# Patient Record
Sex: Male | Born: 1990 | Race: White | Hispanic: No | Marital: Married | Smoking: Current some day smoker
Health system: Southern US, Community
[De-identification: ages and names within clinical notes are randomized; demographics above are authoritative.]

## PROBLEM LIST (undated history)

## (undated) HISTORY — PX: OTHER SURGICAL HISTORY: SHX169

---
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
## 2012-06-23 IMAGING — CR DG RIBS 2V*R*
1 series · 2 of 2 positions shown · non-contrast
Comparison: none

REASON FOR EXAM: rib pain
COMMENTS:   May transport without cardiac monitor

PROCEDURE:     DXR - DXR RIBS RIGHT UNILATERAL  - September 03, 2010  [DATE]
RESULT:     No rib fracture or other acute bony abnormality is identified.
No pneumothorax or pleural effusion is seen.

[Series 1: view not recorded · 0.17mm/px · 2 of 2 slices shown]
[im 1/2]
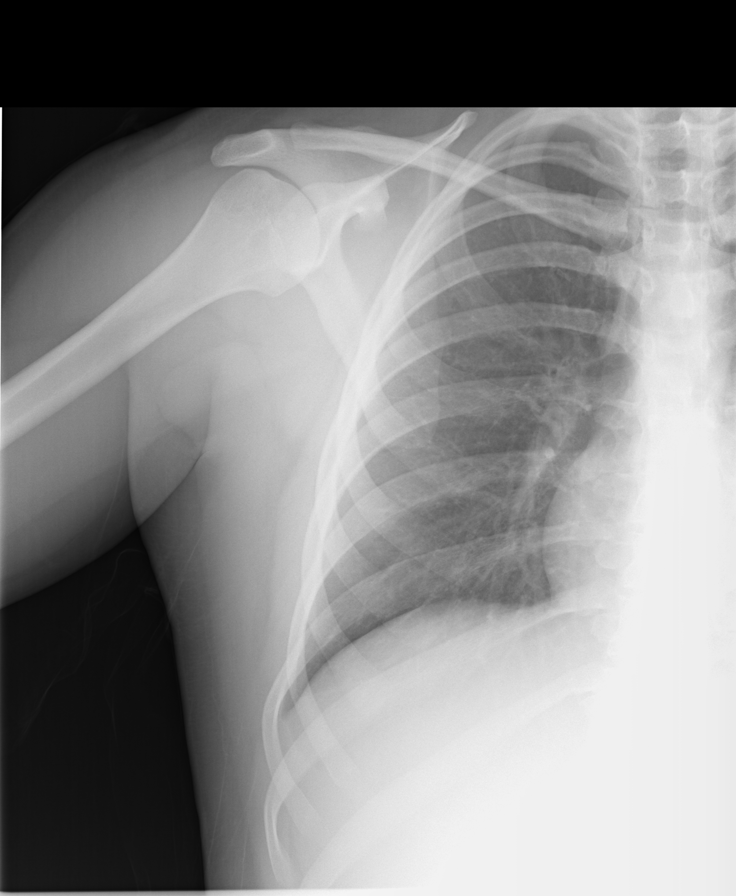
[im 2/2]
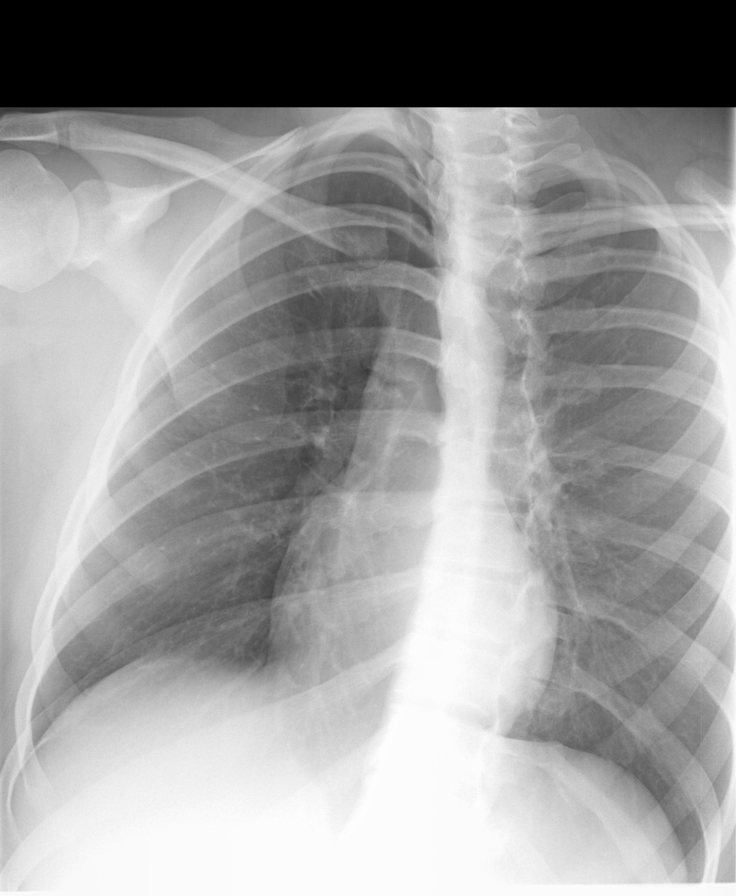

[2 of 2 positions shown; findings below may reference images not displayed]

IMPRESSION: No significant abnormalities are noted.

## 2012-06-23 IMAGING — CR RIGHT TIBIA AND FIBULA - 2 VIEW
1 series · 2 of 2 positions shown · non-contrast
Comparison: none

REASON FOR EXAM: leg pain
COMMENTS:   May transport without cardiac monitor

RESULT:     No fracture, dislocation or other acute bony abnormality is
identified.

[Series 1: view not recorded · 0.17mm/px · 2 of 2 slices shown]
[im 1/2]
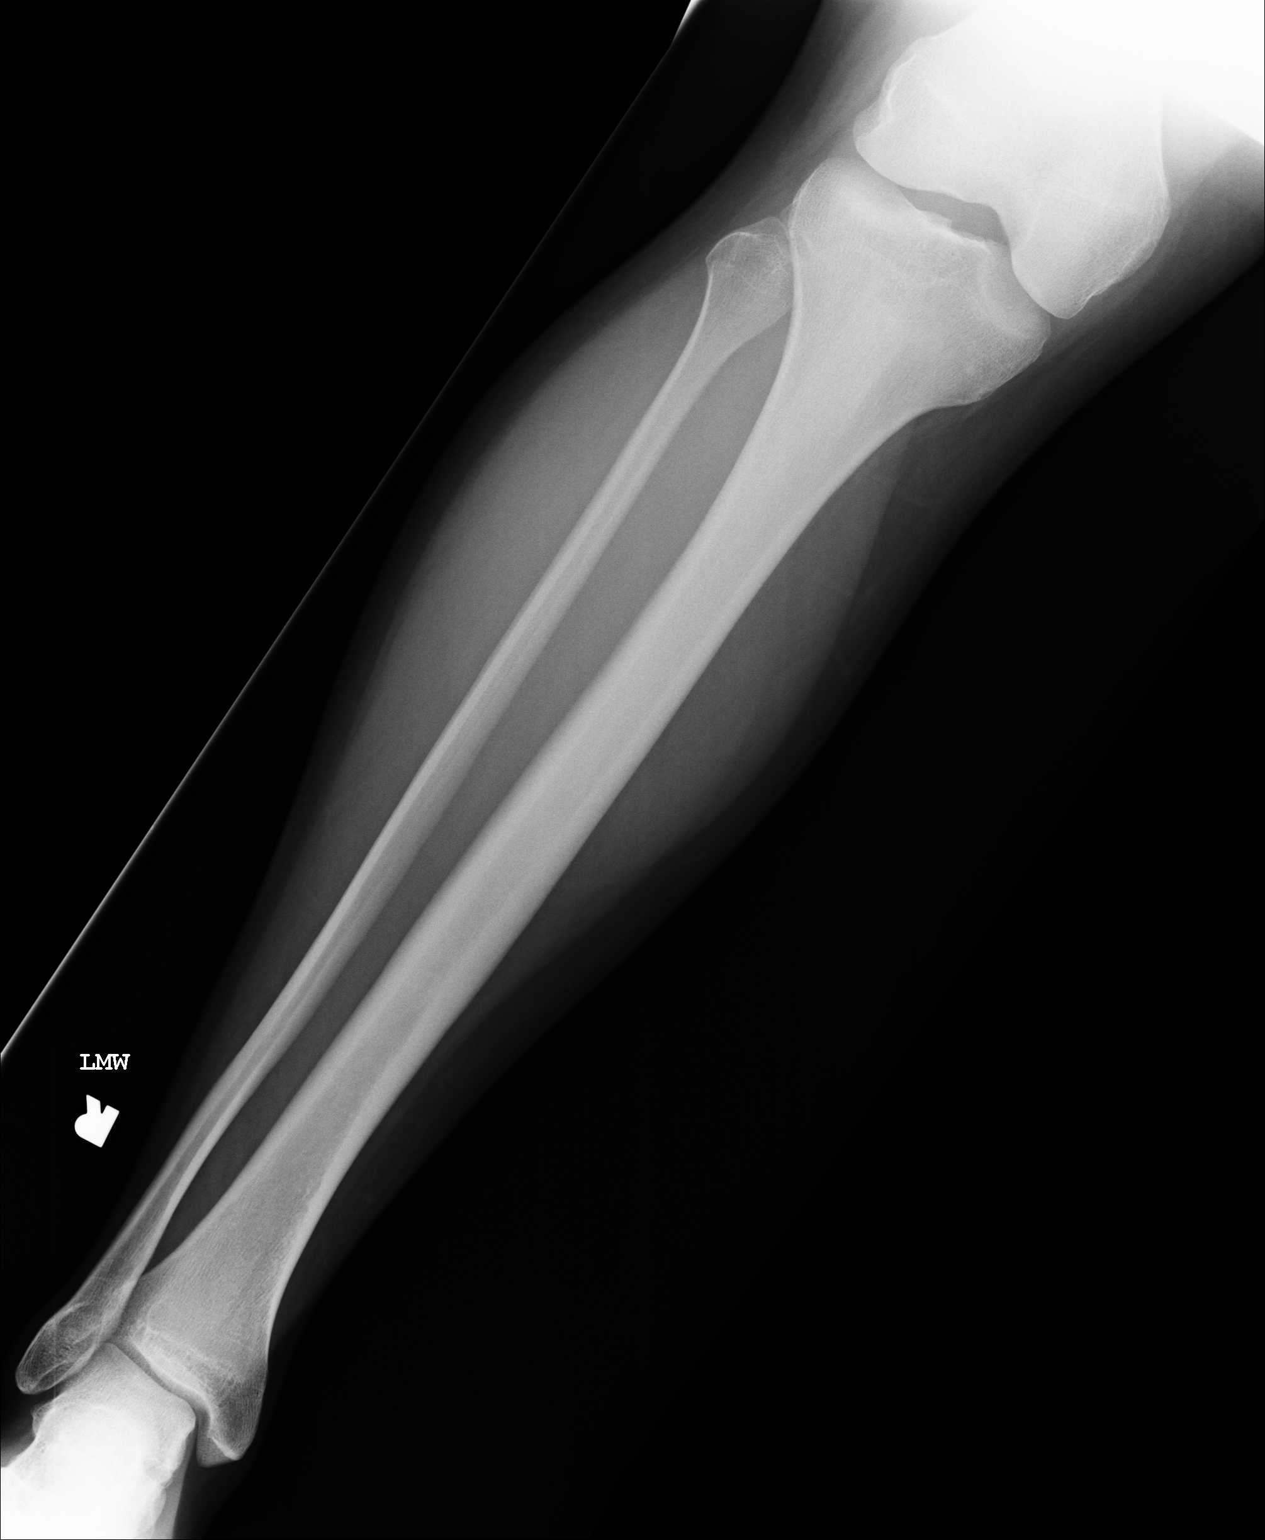
[im 2/2]
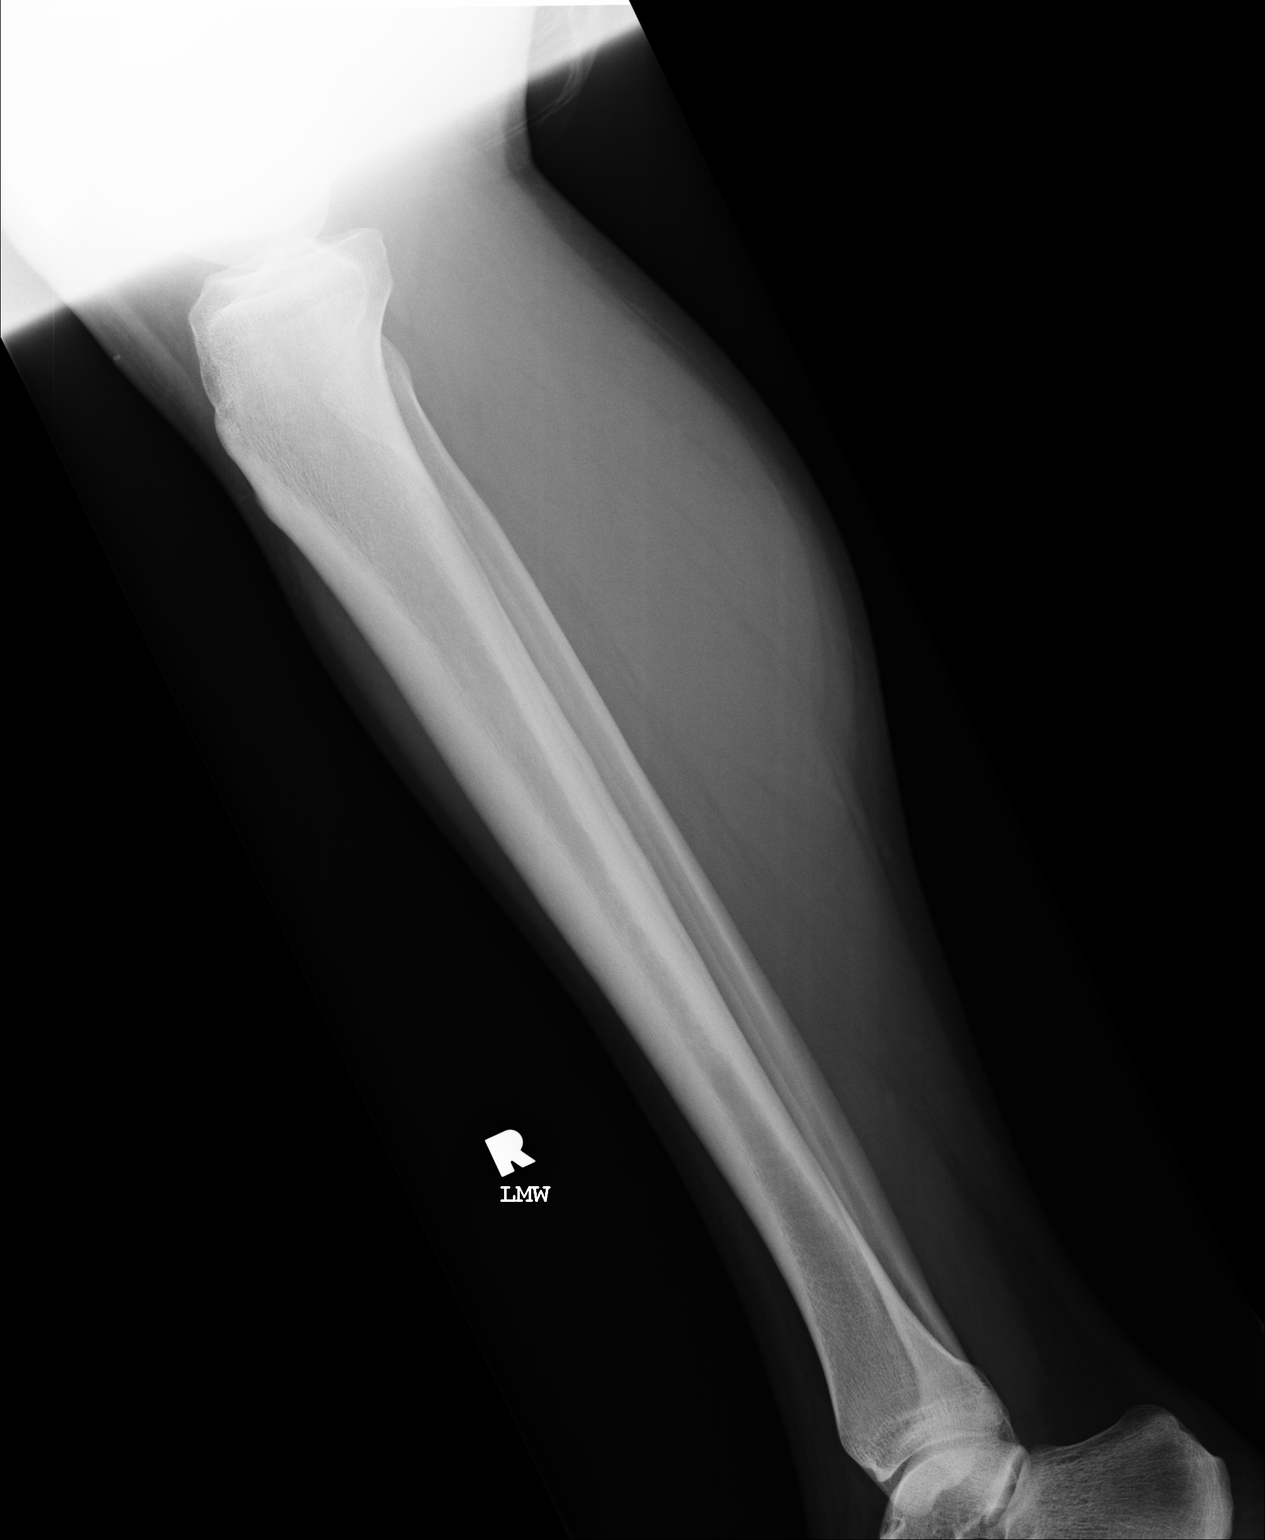

[2 of 2 positions shown; findings below may reference images not displayed]

IMPRESSION: No significant osseous abnormalities are noted.

## 2012-06-23 IMAGING — CR RIGHT ANKLE - COMPLETE 3+ VIEW
1 series · 5 of 5 positions shown · non-contrast
Comparison: none

REASON FOR EXAM: ankle pain
COMMENTS:   May transport without cardiac monitor

PROCEDURE:     DXR - DXR ANKLE RIGHT COMPLETE  - September 03, 2010  [DATE]
RESULT:     No fracture, dislocation or other acute bony abnormality is
identified. The ankle mortise is well maintained.

[Series 1: view not recorded · 0.17mm/px · 5 of 5 slices shown]
[im 1/5]
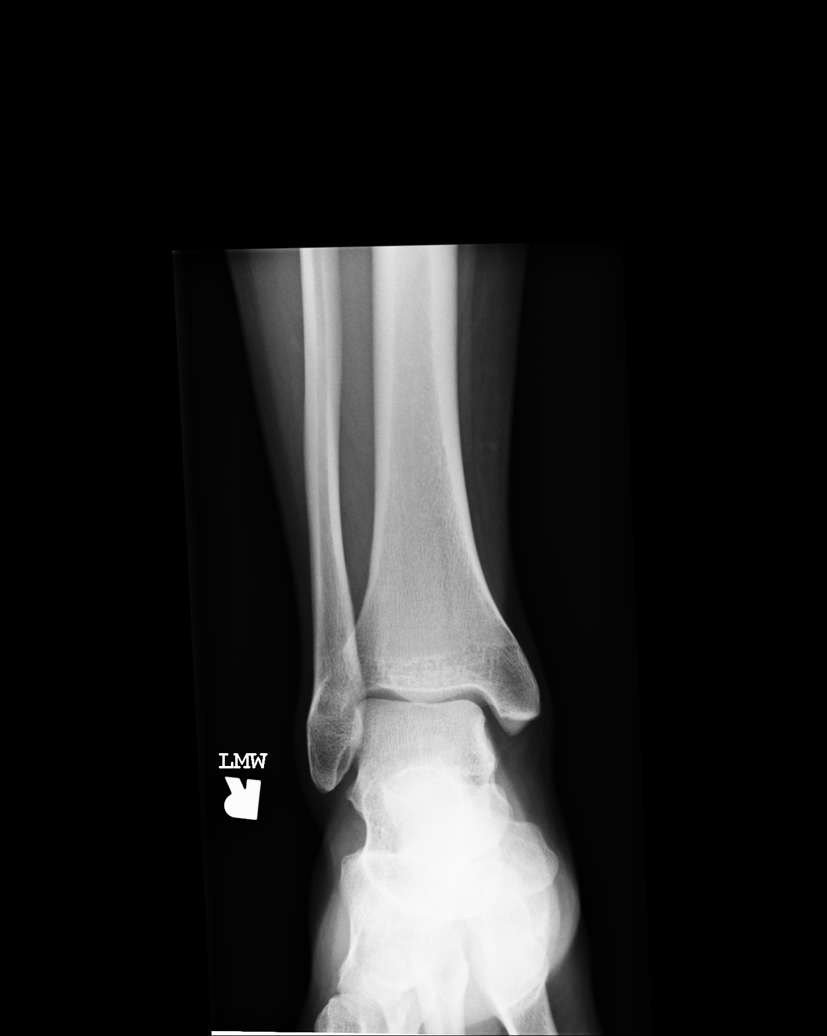
[im 2/5]
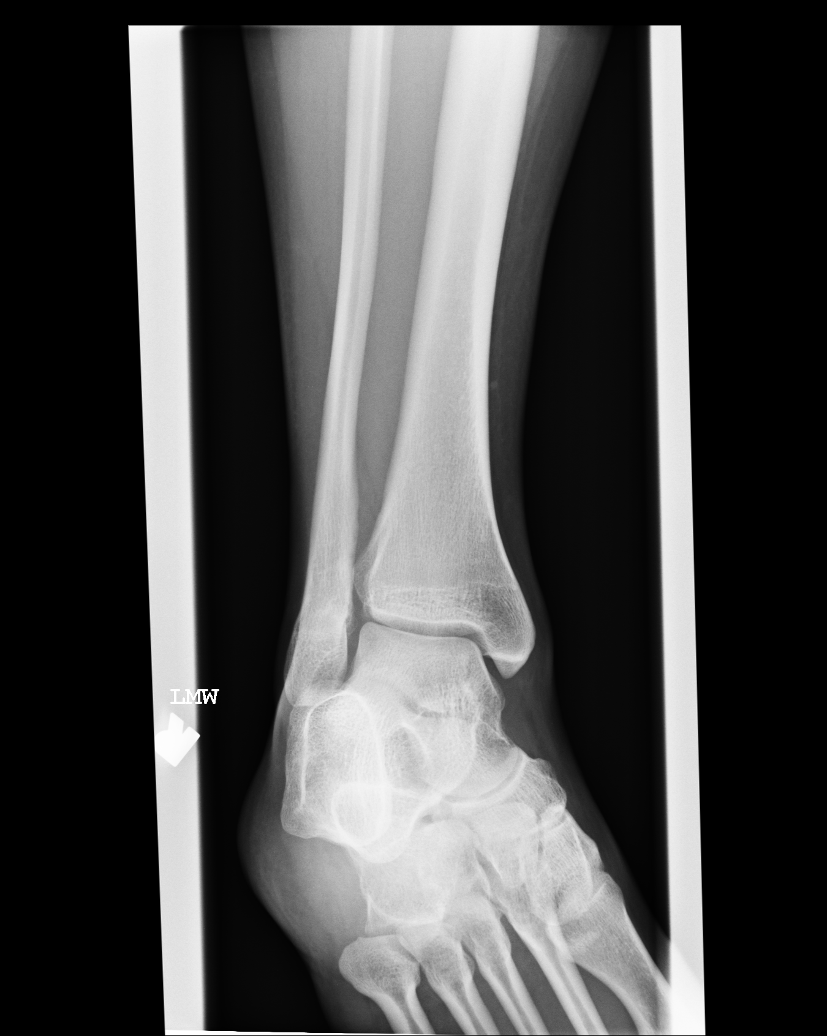
[im 3/5]
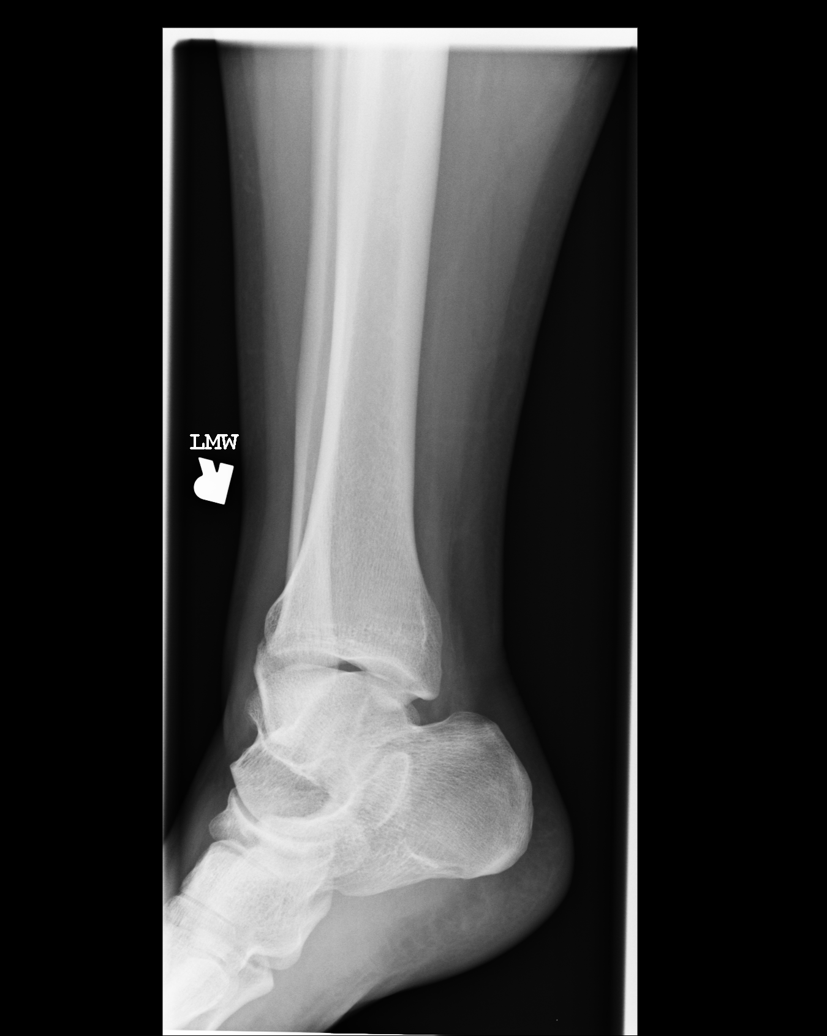
[im 4/5]
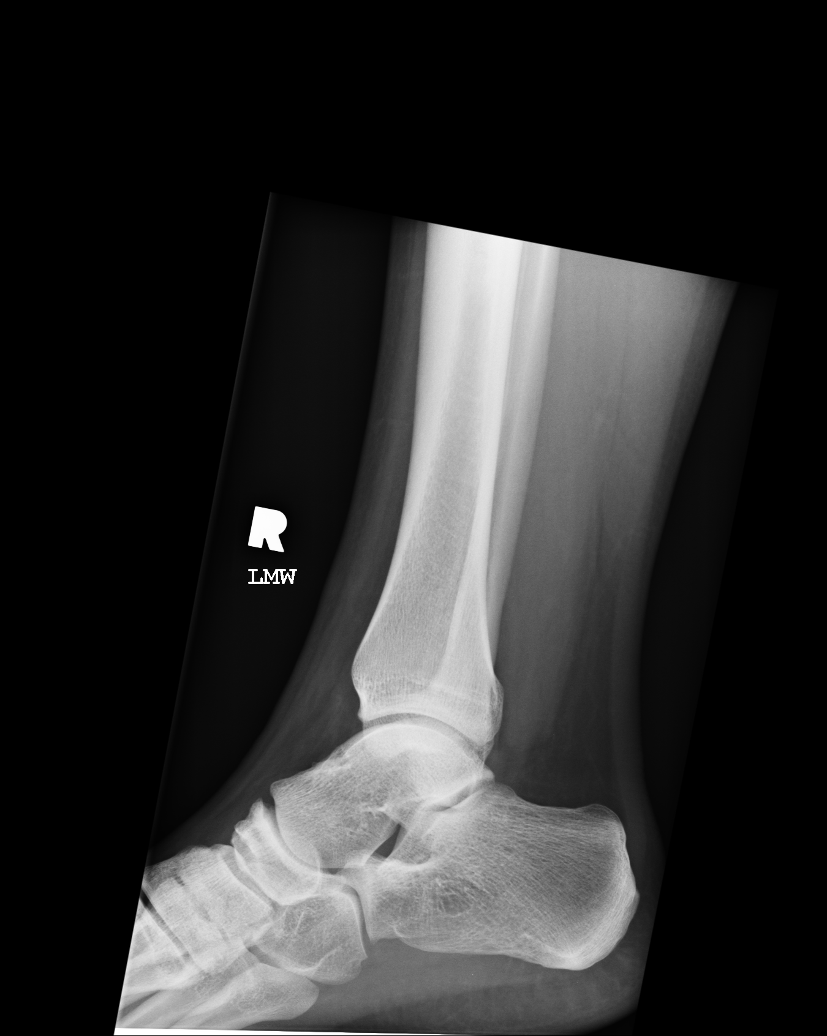
[im 5/5]
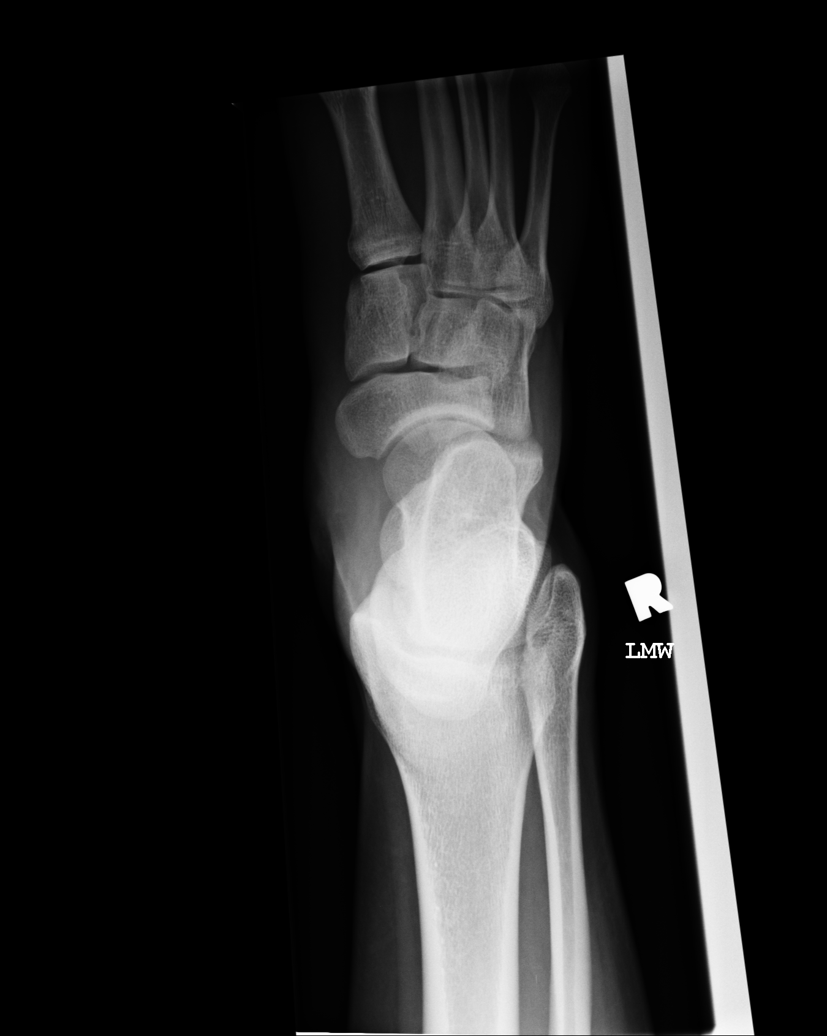

[5 of 5 positions shown; findings below may reference images not displayed]

IMPRESSION: No significant osseous abnormalities are noted.

## 2021-10-14 ENCOUNTER — Emergency Department
Admission: EM | Admit: 2021-10-14 | Discharge: 2021-10-14 | Disposition: A | Discharge status: Home / Self Care | Payer: BC Managed Care – PPO | Attending: Emergency Medicine | Admitting: Emergency Medicine

## 2021-10-14 ENCOUNTER — Encounter: Payer: Self-pay | Admitting: Emergency Medicine

## 2021-10-14 ENCOUNTER — Other Ambulatory Visit: Payer: Self-pay

## 2021-10-14 DIAGNOSIS — M545 Low back pain, unspecified: Secondary | ICD-10-CM | POA: Diagnosis present

## 2021-10-14 DIAGNOSIS — H6642 Suppurative otitis media, unspecified, left ear: Secondary | ICD-10-CM | POA: Insufficient documentation

## 2021-10-14 MED ORDER — CYCLOBENZAPRINE HCL 10 MG PO TABS: 10.0000 mg | ORAL_TABLET | Freq: Three times a day (TID) | ORAL | 0 refills | Status: AC | PRN

## 2021-10-14 MED ORDER — AMOXICILLIN-POT CLAVULANATE 875-125 MG PO TABS
1.0000 | ORAL_TABLET | Freq: Once | ORAL | Status: DC
  Filled 2021-10-14: qty 1

## 2021-10-14 MED ORDER — KETOROLAC TROMETHAMINE 10 MG PO TABS: 10.0000 mg | ORAL_TABLET | Freq: Four times a day (QID) | ORAL | 0 refills | Status: AC | PRN

## 2021-10-14 MED ORDER — KETOROLAC TROMETHAMINE 30 MG/ML IJ SOLN
30.0000 mg | Freq: Once | INTRAMUSCULAR | Status: AC
  Administered 2021-10-14: 30 mg via INTRAVENOUS
  Filled 2021-10-14: qty 1

## 2021-10-14 MED ORDER — AMOXICILLIN-POT CLAVULANATE 875-125 MG PO TABS: 1.0000 | ORAL_TABLET | Freq: Two times a day (BID) | ORAL | 0 refills | Status: AC

## 2021-10-14 MED ORDER — AMOXICILLIN-POT CLAVULANATE 875-125 MG PO TABS
1.0000 | ORAL_TABLET | Freq: Once | ORAL | Status: AC
  Administered 2021-10-14: 1 via ORAL

## 2021-10-14 NOTE — Discharge Instructions (Addendum)
Please take the Toradol 1 pill 4 times a day for the low back pain.  Take the Flexeril 1 pill 3 times a day for the back pain.  They should help.  Please return or follow-up with primary care if your worse or no better in about a week.  Take the Augmentin 1 pill 3 times a day day as needed for the ear infection.  Also follow-up with primary care for them or you can see Dr. Pena Callas who is on-call for ENT if you are not well in about a week.  It might be good to have him look at that ear anyway there is a white patch on your ear canal that I cannot see very clearly that possibly could be something called a cholesteatoma.  If it is it will need to be addressed because over a longer period of time it can actually run your hearing.  Primary care groups or clinics that she can follow-up include [facility] who has a walk-in office.  He can follow-up with [facility] or [facility] or [facility] or [facility] or [facility].

## 2021-10-14 NOTE — ED Provider Triage Note (Signed)
Emergency Medicine Provider Triage Evaluation Note  Maxwell Bell , a 31 y.o. male  was evaluated in triage.  Pt complains of left ear pain for a couple of weeks. Had ear surgery at age 23, has problems ever since. Plays music in his ear to help it feel better. No nasal congestion/fever. Also has low back pain since prison which he attributes to laying on thin mattresses, no leg weakness/paresthesias/urinary/bladder issues. Able to ambulate.  Review of Systems  Positive: Ear pain, back pain Negative: weakness/paresthesias/urinary/bladder issues  Physical Exam  Ht 5\' 10"  (1.778 m)   Wt 119.3 kg   BMI 37.74 kg/m  Gen:   Awake, no distress   Resp:  Normal effort  MSK:   Moves extremities without difficulty  Other:    Medical Decision Making  Medically screening exam initiated at 1:19 PM.  Appropriate orders placed.  Dolores Patty was informed that the remainder of the evaluation will be completed by another provider, this initial triage assessment does not replace that evaluation, and the importance of remaining in the ED until their evaluation is complete.     Marquette Old, PA-C 10/14/21 1322

## 2021-10-14 NOTE — ED Triage Notes (Signed)
Pt reports pain to left ear and pressure for the last 2 weeks. Pt denies nasal congestion or other sx's.  Pt reports also has some back pain. Pt reports was put in prison 4-5 years ago and when he was released he has back pain and he continues to have pain to his back

## 2021-10-22 NOTE — ED Provider Notes (Signed)
[facility] Provider Note    Event Date/Time   First MD Initiated Contact with Patient 10/14/21 1525     (approximate)   History   Otalgia and Back Pain   HPI  Maxwell Bell is a 31 y.o. male patient comes in complaining of low back pain on the right side which develops is chronic but he is out of medicines.  He also complains of pain in his ear.  This has been going on for several days.      Physical Exam   Triage Vital Signs: ED Triage Vitals  Enc Vitals Group     BP 10/14/21 1319 (!) 137/97     Pulse Rate 10/14/21 1319 76     Resp 10/14/21 1319 20     Temp 10/14/21 1319 98.1 F (36.7 C)     Temp Source 10/14/21 1319 Oral     SpO2 10/14/21 1319 99 %     Weight 10/14/21 1257 263 lb (119.3 kg)     Height 10/14/21 1257 5\' 10"  (1.778 m)     Head Circumference --      Peak Flow --      Pain Score 10/14/21 1256 8     Pain Loc --      Pain Edu? --      Excl. in [place]? --     Most recent vital signs: Vitals:   10/14/21 1319 10/14/21 1627  BP: (!) 137/97 125/81  Pulse: 76 (!) 57  Resp: 20 18  Temp: 98.1 F (36.7 C) 98.1 F (36.7 C)  SpO2: 99% 99%     General: Awake, no distress.  Ears right TM is somewhat red there is a whitish area in the center of the TM which I am not sure it is a scar or a cholesteatoma.  It looks somewhat irregular and lumpy. CV:  Good peripheral perfusion.  Heart regular rate and rhythm no audible murmurs Resp:  Normal effort.  Lungs are clear Abd:  No distention.  Nontender Back: Tender over the muscles on the right side.  No spinal tenderness no hip tenderness.  Straight leg raise is negative.  There is no numbness or weakness in the legs.  Patient reports pain is chronic.   ED Results / Procedures / Treatments   Labs (all labs ordered are listed, but only abnormal results are displayed) Labs Reviewed - No data to display   EKG     RADIOLOGY   PROCEDURES:  Critical Care performed:    Procedures   MEDICATIONS ORDERED IN ED: Medications  ketorolac (TORADOL) 30 MG/ML injection 30 mg (30 mg Intravenous Given 10/14/21 1625)  amoxicillin-clavulanate (AUGMENTIN) 875-125 MG per tablet 1 tablet (1 tablet Oral Given 10/14/21 1621)     IMPRESSION / MDM / ASSESSMENT AND PLAN / ED COURSE  I reviewed the triage vital signs and the nursing notes. I have given the patient some medications for his back pain and Augmentin for the earache and the otitis media.  I will have him follow-up with ENT to check on the white area and see if it is a cholesterol it [place] or not.   Patient's presentation is most consistent with acute, uncomplicated illness.       FINAL CLINICAL IMPRESSION(S) / ED DIAGNOSES   Final diagnoses:  Acute right-sided low back pain without sciatica  Suppurative otitis media of left ear, unspecified chronicity     Rx / DC Orders   ED Discharge Orders  Ordered    cyclobenzaprine (FLEXERIL) 10 MG tablet  3 times daily PRN        10/14/21 1617    ketorolac (TORADOL) 10 MG tablet  Every 6 hours PRN        10/14/21 1617    amoxicillin-clavulanate (AUGMENTIN) 875-125 MG tablet  2 times daily        10/14/21 1622             Note:  This document was prepared using Dragon voice recognition software and may include unintentional dictation errors.   Nena Polio, MD 10/22/21 2300

## 2021-12-02 ENCOUNTER — Other Ambulatory Visit: Payer: Self-pay

## 2021-12-02 ENCOUNTER — Emergency Department
Admission: EM | Admit: 2021-12-02 | Discharge: 2021-12-02 | Disposition: A | Discharge status: Home / Self Care | Payer: BC Managed Care – PPO | Attending: Emergency Medicine | Admitting: Emergency Medicine

## 2021-12-02 DIAGNOSIS — T7840XA Allergy, unspecified, initial encounter: Secondary | ICD-10-CM | POA: Insufficient documentation

## 2021-12-02 DIAGNOSIS — R21 Rash and other nonspecific skin eruption: Secondary | ICD-10-CM | POA: Diagnosis present

## 2021-12-02 LAB — CBC WITH DIFFERENTIAL/PLATELET
Abs Immature Granulocytes: 0.03 10*3/uL (ref 0.00–0.07)
Basophils Absolute: 0.1 10*3/uL (ref 0.0–0.1)
Basophils Relative: 1 %
Eosinophils Absolute: 0.7 10*3/uL — ABNORMAL HIGH (ref 0.0–0.5)
Eosinophils Relative: 9 %
HCT: 44.6 % (ref 39.0–52.0)
Hemoglobin: 15.5 g/dL (ref 13.0–17.0)
Immature Granulocytes: 0 %
Lymphocytes Relative: 35 %
Lymphs Abs: 2.7 10*3/uL (ref 0.7–4.0)
MCH: 29.3 pg (ref 26.0–34.0)
MCHC: 34.8 g/dL (ref 30.0–36.0)
MCV: 84.3 fL (ref 80.0–100.0)
Monocytes Absolute: 0.8 10*3/uL (ref 0.1–1.0)
Monocytes Relative: 10 %
Neutro Abs: 3.5 10*3/uL (ref 1.7–7.7)
Neutrophils Relative %: 45 %
Platelets: 231 10*3/uL (ref 150–400)
RBC: 5.29 MIL/uL (ref 4.22–5.81)
RDW: 12.2 % (ref 11.5–15.5)
WBC: 7.7 10*3/uL (ref 4.0–10.5)
nRBC: 0 % (ref 0.0–0.2)

## 2021-12-02 LAB — BASIC METABOLIC PANEL
Anion gap: 8 (ref 5–15)
BUN: 14 mg/dL (ref 6–20)
CO2: 22 mmol/L (ref 22–32)
Calcium: 9 mg/dL (ref 8.9–10.3)
Chloride: 108 mmol/L (ref 98–111)
Creatinine, Ser: 0.86 mg/dL (ref 0.61–1.24)
GFR, Estimated: >60 mL/min (ref >60)
Glucose, Bld: 115 mg/dL — ABNORMAL HIGH (ref 70–99)
Potassium: 3.6 mmol/L (ref 3.5–5.1)
Sodium: 138 mmol/L (ref 135–145)

## 2021-12-02 MED ORDER — METHYLPREDNISOLONE SODIUM SUCC 125 MG IJ SOLR: 125.0000 mg | Freq: Once | INTRAMUSCULAR | Status: DC

## 2021-12-02 MED ORDER — PREDNISONE 10 MG (21) PO TBPK: ORAL_TABLET | ORAL | 0 refills | Status: AC

## 2021-12-02 MED ORDER — METHYLPREDNISOLONE SODIUM SUCC 125 MG IJ SOLR
INTRAMUSCULAR | Status: AC
  Filled 2021-12-02: qty 2

## 2021-12-02 MED ORDER — METHYLPREDNISOLONE SODIUM SUCC 125 MG IJ SOLR
125.0000 mg | Freq: Every day | INTRAMUSCULAR | Status: DC
  Administered 2021-12-02: 125 mg via INTRAVENOUS

## 2021-12-02 MED ORDER — FAMOTIDINE IN NACL 20-0.9 MG/50ML-% IV SOLN
20.0000 mg | Freq: Once | INTRAVENOUS | Status: AC
  Administered 2021-12-02: 20 mg via INTRAVENOUS
  Filled 2021-12-02: qty 50

## 2021-12-02 MED ORDER — DIPHENHYDRAMINE HCL 50 MG/ML IJ SOLN
50.0000 mg | Freq: Once | INTRAMUSCULAR | Status: AC
  Administered 2021-12-02: 50 mg via INTRAVENOUS
  Filled 2021-12-02: qty 1

## 2021-12-02 NOTE — ED Provider Notes (Signed)
[facility] Provider Note  Patient Contact: 10:06 PM (approximate)   History   Allergic Reaction   HPI  Maxwell Bell is a 31 y.o. male with an unremarkable past medical history, presents to the emergency department with facial rash that he noticed after he got out of the shower last night.  Patient states that he did start a new shampoo.  No vomiting or diarrhea.  Patient does have a small region of rash along left axilla.  No chest pain, chest tightness or shortness of breath.  No abdominal pain or syncope.      Physical Exam   Triage Vital Signs: ED Triage Vitals  Enc Vitals Group     BP 12/02/21 2151 (!) 134/97     Pulse Rate 12/02/21 2148 82     Resp 12/02/21 2148 16     Temp 12/02/21 2148 98.5 F (36.9 C)     Temp Source 12/02/21 2148 Oral     SpO2 12/02/21 2148 98 %     Weight 12/02/21 2149 255 lb (115.7 kg)     Height 12/02/21 2149 5\' 10"  (1.778 m)     Head Circumference --      Peak Flow --      Pain Score 12/02/21 2149 0     Pain Loc --      Pain Edu? --      Excl. in [place]? --     Most recent vital signs: Vitals:   12/02/21 2148 12/02/21 2151  BP:  (!) 134/97  Pulse: 82   Resp: 16   Temp: 98.5 F (36.9 C)   SpO2: 98%      General: Alert and in no acute distress. Eyes:  PERRL. EOMI. Head: No acute traumatic findings ENT:      Nose: No congestion/rhinnorhea.      Mouth/Throat: Mucous membranes are moist. Neck: No stridor. No cervical spine tenderness to palpation. Cardiovascular:  Good peripheral perfusion Respiratory: Normal respiratory effort without tachypnea or retractions. Lungs CTAB. Good air entry to the bases with no decreased or absent breath sounds. Gastrointestinal: Bowel sounds 4 quadrants. Soft and nontender to palpation. No guarding or rigidity. No palpable masses. No distention. No CVA tenderness. Musculoskeletal: Full range of motion to all extremities.  Neurologic:  No gross focal neurologic deficits are  appreciated.  Skin: Patient has erythematous rash of face with small region of yellow crusting along right nose.    ED Results / Procedures / Treatments   Labs (all labs ordered are listed, but only abnormal results are displayed) Labs Reviewed  CBC WITH DIFFERENTIAL/PLATELET - Abnormal; Notable for the following components:      Result Value   Eosinophils Absolute 0.7 (*)    All other components within normal limits  BASIC METABOLIC PANEL - Abnormal; Notable for the following components:   Glucose, Bld 115 (*)    All other components within normal limits        PROCEDURES:  Critical Care performed: No  Procedures   MEDICATIONS ORDERED IN ED: Medications  methylPREDNISolone sodium succinate (SOLU-MEDROL) 125 mg/2 mL injection 125 mg (125 mg Intravenous Not Given 12/02/21 2318)  famotidine (PEPCID) IVPB 20 mg premix (0 mg Intravenous Stopped 12/02/21 2253)  diphenhydrAMINE (BENADRYL) injection 50 mg (50 mg Intravenous Given 12/02/21 2217)     IMPRESSION / MDM / ASSESSMENT AND PLAN / ED COURSE  I reviewed the triage vital signs and the nursing notes.  Assessment and plan: Rash:  31 year old male presents to the emergency department with facial rash.  Vital signs are reassuring at triage.  On exam, patient was alert, active and nontoxic-appearing.  Patient was given IV Solu-Medrol, Pepcid and Benadryl and rash improved.  Patient was discharged with tapered prednisone and advised to continue taking daily Benadryl and Pepcid until rash completely resolves.     FINAL CLINICAL IMPRESSION(S) / ED DIAGNOSES   Final diagnoses:  Allergic reaction, initial encounter     Rx / DC Orders   ED Discharge Orders          Ordered    predniSONE (STERAPRED UNI-PAK 21 TAB) 10 MG (21) TBPK tablet        12/02/21 2335             Note:  This document was prepared using Dragon voice recognition software and may include unintentional  dictation errors.   Pia Mau [place], PA-C 12/02/21 2354    Sharman Cheek, MD 12/05/21 [phone]

## 2021-12-02 NOTE — Discharge Instructions (Signed)
You can take tapered prednisone as directed. Please take 40 mg of Pepcid once daily which is over-the-counter. You can take 25 mg of Benadryl every 8 hours until rash resolves.

## 2021-12-02 NOTE — ED Notes (Signed)
Pt Dc to home. DC instructions reviewed with all questions answered. Pt verbalizes understanding. Iv removed, cath intact. Pressure dressing applied. No bleeding noted at site. Pt ambulatory out of dept with steady gait

## 2021-12-02 NOTE — ED Notes (Signed)
Pt denies difficulty swallowing or talking at this time. Pt has swelling to R side of face.

## 2021-12-02 NOTE — ED Triage Notes (Signed)
Pt to ED from home for allergic reaction. Pt breathing normally at this time. Pt has some mild swelling to the right eye. Symptoms started last night around 8 pm. Pt denies any N/V/D. Pt denies any SOB. Pt CAOx4 and in no acute distress.

## 2023-03-31 ENCOUNTER — Other Ambulatory Visit: Payer: Self-pay

## 2023-03-31 DIAGNOSIS — J029 Acute pharyngitis, unspecified: Secondary | ICD-10-CM | POA: Insufficient documentation

## 2023-03-31 LAB — GROUP A STREP BY PCR: Group A Strep by PCR: NOT DETECTED

## 2023-03-31 NOTE — ED Triage Notes (Signed)
 Pt reports sore throat since last week, painful with swallowing. Also thinks he may have pinkeye to his left eye, watering, redness, and "crusty". Has used OTC eye drops w/o much relief. A&O x4, NAD noted, denies fevers. VSS.

## 2023-04-01 ENCOUNTER — Emergency Department
Admission: EM | Admit: 2023-04-01 | Discharge: 2023-04-01 | Disposition: A | Discharge status: Home / Self Care | Attending: Emergency Medicine | Admitting: Emergency Medicine

## 2023-04-01 DIAGNOSIS — J029 Acute pharyngitis, unspecified: Secondary | ICD-10-CM

## 2023-04-01 MED ORDER — DEXAMETHASONE 4 MG PO TABS
10.0000 mg | ORAL_TABLET | Freq: Once | ORAL | Status: AC
  Administered 2023-04-01: 10 mg via ORAL
  Filled 2023-04-01: qty 3
  Filled 2023-04-01: qty 2.5

## 2023-04-01 MED ORDER — MAGIC MOUTHWASH: ORAL | 0 refills | Status: AC

## 2023-04-01 MED ORDER — MAGIC MOUTHWASH
10.0000 mL | Freq: Once | ORAL | Status: AC
  Administered 2023-04-01: 10 mL via ORAL
  Filled 2023-04-01: qty 10

## 2023-04-01 NOTE — ED Provider Notes (Signed)
 [facility] Provider Note    Event Date/Time   First MD Initiated Contact with Patient 04/01/23 0144     (approximate)   History   Sore Throat   HPI  Maxwell Bell is a 33 y.o. male who presents to the ED from home with a chief complaint of sore throat since last week.  Endorses painful swallowing.  Endorses cough, congestion, watery eyes.  Denies chest pain, shortness of breath, abdominal pain, nausea, vomiting or dizziness.     Past Medical History  History reviewed. No pertinent past medical history.   Active Problem List  There are no active problems to display for this patient.    Past Surgical History   Past Surgical History:  Procedure Laterality Date   Left Ear Sx       Home Medications   Prior to Admission medications   Medication Sig Start Date End Date Taking? Authorizing Provider  magic mouthwash SOLN 12mL Anbesol 30mL Benadryl 30mL Mylanta  5mL swish, gargle & spit q8hr prn throat discomfort 04/01/23  Yes Irean Hong, MD  amoxicillin-clavulanate (AUGMENTIN) 875-125 MG tablet Take 1 tablet by mouth 2 (two) times daily. 10/14/21   Arnaldo Natal, MD  cyclobenzaprine (FLEXERIL) 10 MG tablet Take 1 tablet (10 mg total) by mouth 3 (three) times daily as needed for muscle spasms. 10/14/21   Arnaldo Natal, MD  ketorolac (TORADOL) 10 MG tablet Take 1 tablet (10 mg total) by mouth every 6 (six) hours as needed. 10/14/21   Arnaldo Natal, MD  predniSONE (STERAPRED UNI-PAK 21 TAB) 10 MG (21) TBPK tablet Take 6 tablets the first day, take 5 tablets the second day, take 4 tablets the third day, take 3 tablets the fourth day, take 2 tablets the fifth day, take 1 tablet the sixth day. 12/02/21   Orvil Feil, PA-C     Allergies  Patient has no known allergies.   Family History  History reviewed. No pertinent family history.   Physical Exam  Triage Vital Signs: ED Triage Vitals [03/31/23 2300]  Encounter Vitals Group     BP  (!) 128/98     Systolic BP Percentile      Diastolic BP Percentile      Pulse Rate 72     Resp 16     Temp 98.2 F (36.8 C)     Temp Source Oral     SpO2 100 %     Weight 265 lb (120.2 kg)     Height 5\' 10"  (1.778 m)     Head Circumference      Peak Flow      Pain Score 7     Pain Loc      Pain Education      Exclude from Growth Chart     Updated Vital Signs: BP (!) 128/98 (BP Location: Left Arm)   Pulse 72   Temp 98.2 F (36.8 C) (Oral)   Resp 16   Ht 5\' 10"  (1.778 m)   Wt 120.2 kg   SpO2 100%   BMI 38.02 kg/m    General: Awake, no distress.  CV:  RRR.  Good peripheral perfusion.  Resp:  Normal effort.  CTAB. Abd:  No distention.  Other:  No conjunctival exudates.  Nasal congestion noted.  Posterior pharynx mildly erythematous without tonsillar exudates or peritonsillar abscess.  There is no hoarse or muffled voice.  There is no drooling.  Shotty anterior cervical LAD noted.  ED Results / Procedures / Treatments  Labs (all labs ordered are listed, but only abnormal results are displayed) Labs Reviewed  GROUP A STREP BY PCR     EKG  None   RADIOLOGY None   Official radiology report(s): No results found.   PROCEDURES:  Critical Care performed: No  Procedures   MEDICATIONS ORDERED IN ED: Medications  dexamethasone (DECADRON) tablet 10 mg (10 mg Oral Given 04/01/23 0158)  magic mouthwash (10 mLs Oral Given 04/01/23 0158)     IMPRESSION / MDM / ASSESSMENT AND PLAN / ED COURSE  I reviewed the triage vital signs and the nursing notes.                             33 year old male presenting with sore throat and cold symptoms.  Group A strep is negative.  Administer Decadron, Magic mouthwash for symptomatic relief.  Strict return precautions given.  Patient verbalizes understanding and agrees with plan of care.  Patient's presentation is most consistent with acute, uncomplicated illness.    FINAL CLINICAL IMPRESSION(S) / ED DIAGNOSES   Final  diagnoses:  Sore throat  Pharyngitis, unspecified etiology     Rx / DC Orders   ED Discharge Orders          Ordered    magic mouthwash SOLN        04/01/23 0148             Note:  This document was prepared using Dragon voice recognition software and may include unintentional dictation errors.   Irean Hong, MD 04/01/23 [phone]

## 2023-04-01 NOTE — Discharge Instructions (Signed)
 You may take Magic mouthwash as needed for throat pain.  Drink plenty of fluids daily.  Return to the ER for worsening symptoms, persistent vomiting or other concerns.
